# Patient Record
Sex: Male | Born: 1990 | Race: Black or African American | Hispanic: No | Marital: Single | Smoking: Never smoker
Health system: Southern US, Community
[De-identification: ages and names within clinical notes are randomized; demographics above are authoritative.]

## PROBLEM LIST (undated history)

## (undated) HISTORY — PX: HAND SURGERY: SHX662

## (undated) HISTORY — PX: OTHER SURGICAL HISTORY: SHX169

---

## 2012-09-05 ENCOUNTER — Emergency Department (HOSPITAL_BASED_OUTPATIENT_CLINIC_OR_DEPARTMENT_OTHER)
Admission: EM | Admit: 2012-09-05 | Discharge: 2012-09-05 | Disposition: A | Discharge status: Home / Self Care | Payer: Self-pay | Attending: Emergency Medicine | Admitting: Emergency Medicine

## 2012-09-05 ENCOUNTER — Emergency Department (HOSPITAL_BASED_OUTPATIENT_CLINIC_OR_DEPARTMENT_OTHER): Payer: Self-pay

## 2012-09-05 ENCOUNTER — Encounter (HOSPITAL_BASED_OUTPATIENT_CLINIC_OR_DEPARTMENT_OTHER): Payer: Self-pay | Admitting: *Deleted

## 2012-09-05 DIAGNOSIS — W219XXA Striking against or struck by unspecified sports equipment, initial encounter: Secondary | ICD-10-CM | POA: Insufficient documentation

## 2012-09-05 DIAGNOSIS — Y9367 Activity, basketball: Secondary | ICD-10-CM | POA: Insufficient documentation

## 2012-09-05 DIAGNOSIS — Y9239 Other specified sports and athletic area as the place of occurrence of the external cause: Secondary | ICD-10-CM | POA: Insufficient documentation

## 2012-09-05 DIAGNOSIS — IMO0002 Reserved for concepts with insufficient information to code with codable children: Secondary | ICD-10-CM | POA: Insufficient documentation

## 2012-09-05 MED ORDER — HYDROCODONE-ACETAMINOPHEN 5-325 MG PO TABS: 2.0000 | ORAL_TABLET | Freq: Four times a day (QID) | ORAL | Status: DC | PRN

## 2012-09-05 NOTE — ED Provider Notes (Signed)
History     CSN: [id number]  Arrival date & time 09/05/12  2136   First MD Initiated Contact with Patient 09/05/12 2150      Chief Complaint  Patient presents with  . Knee Injury    (Consider location/radiation/quality/duration/timing/severity/associated sxs/prior treatment) HPI Pt reports he was playing basketball earlier this evening when another player rolled onto his L leg, hyperextending his L knee. He is complaining of moderate to severe aching pain in knee, worse with movement and bearing weight.   History reviewed. No pertinent past medical history.  Past Surgical History  Procedure Laterality Date  . Hand surgery      No family history on file.  History  Substance Use Topics  . Smoking status: Never Smoker   . Smokeless tobacco: Never Used  . Alcohol Use: Not on file      Review of Systems All other systems reviewed and are negative except as noted in HPI.   Allergies  Review of patient's allergies indicates no known allergies.  Home Medications  No current outpatient prescriptions on file.  BP 131/86  Pulse 68  Temp(Src) 97.6 F (36.4 C) (Oral)  Resp 16  Ht 6\' 6"  (1.981 m)  Wt 220 lb (99.791 kg)  BMI 25.43 kg/m2  SpO2 99%  Physical Exam  Nursing note and vitals reviewed. Constitutional: He is oriented to person, place, and time. He appears well-developed and well-nourished.  HENT:  Head: Normocephalic and atraumatic.  Eyes: EOM are normal. Pupils are equal, round, and reactive to light.  Neck: Normal range of motion. Neck supple.  Cardiovascular: Normal rate, normal heart sounds and intact distal pulses.   Pulmonary/Chest: Effort normal and breath sounds normal.  Abdominal: Bowel sounds are normal. He exhibits no distension. There is no tenderness.  Musculoskeletal: Normal range of motion. He exhibits tenderness (tender to palpation of the L knee, minimal effusion, some laxity with posterior drawer). He exhibits no edema.  Neurological: He  is alert and oriented to person, place, and time. He has normal strength. No cranial nerve deficit or sensory deficit.  Skin: Skin is warm and dry. No rash noted.  Psychiatric: He has a normal mood and affect.    ED Course  Procedures (including critical care time)  Labs Reviewed - No data to display No results found.   No diagnosis found.    MDM  Xray reviewed, neg per my read, suspect a ligamentous injury. 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 Will apply a knee immobilizer, crutches, pain meds. Advised to followup with team physicians for recheck and consideration of MRI.         Charles B. Bernette Mayers, MD 09/06/12 1141

## 2012-09-05 NOTE — ED Notes (Addendum)
Injured left knee while playing basketball approximately 1 1/2 hours ago.  Another player fell onto his left leg and knee.  Left knee mildly TTP.  Pt. able to ambulate, but limps.  No swelling noted.

## 2012-09-05 NOTE — ED Notes (Signed)
Pt reprots playing basketball and another player landed on his left knee- walks with limp

## 2012-09-07 ENCOUNTER — Encounter (HOSPITAL_BASED_OUTPATIENT_CLINIC_OR_DEPARTMENT_OTHER): Payer: Self-pay | Admitting: *Deleted

## 2012-09-07 ENCOUNTER — Emergency Department (HOSPITAL_BASED_OUTPATIENT_CLINIC_OR_DEPARTMENT_OTHER)
Admission: EM | Admit: 2012-09-07 | Discharge: 2012-09-07 | Disposition: A | Discharge status: Home / Self Care | Payer: Self-pay | Attending: Emergency Medicine | Admitting: Emergency Medicine

## 2012-09-07 DIAGNOSIS — H53149 Visual discomfort, unspecified: Secondary | ICD-10-CM | POA: Insufficient documentation

## 2012-09-07 DIAGNOSIS — R609 Edema, unspecified: Secondary | ICD-10-CM | POA: Insufficient documentation

## 2012-09-07 DIAGNOSIS — H109 Unspecified conjunctivitis: Secondary | ICD-10-CM | POA: Insufficient documentation

## 2012-09-07 MED ORDER — ERYTHROMYCIN 5 MG/GM OP OINT
TOPICAL_OINTMENT | Freq: Four times a day (QID) | OPHTHALMIC | Status: DC
  Administered 2012-09-07: 1 via OPHTHALMIC
  Filled 2012-09-07: qty 3.5

## 2012-09-07 MED ORDER — TETRACAINE HCL 0.5 % OP SOLN
OPHTHALMIC | Status: AC
  Administered 2012-09-07: 23:00:00
  Filled 2012-09-07: qty 2

## 2012-09-07 MED ORDER — FLUORESCEIN SODIUM 1 MG OP STRP
ORAL_STRIP | OPHTHALMIC | Status: AC
  Administered 2012-09-07: 23:00:00
  Filled 2012-09-07: qty 1

## 2012-09-07 NOTE — ED Provider Notes (Signed)
History     CSN: [id number]  Arrival date & time 09/07/12  2112   First MD Initiated Contact with Patient 09/07/12 2311      Chief Complaint  Patient presents with  . Eye Pain    (Consider location/radiation/quality/duration/timing/severity/associated sxs/prior treatment) HPI This is a 22 year old male with pain, erythema and mild periorbital edema of the right eye. It began yesterday. He thinks it may have been triggered by him removing his contact lens in that eye. The pain is moderate. There is some mild photophobia. He denies blurred vision. His symptoms are eased if he keeps his eye shut.  History reviewed. No pertinent past medical history.  Past Surgical History  Procedure Laterality Date  . Hand surgery    . Thumb surgery      No family history on file.  History  Substance Use Topics  . Smoking status: Never Smoker   . Smokeless tobacco: Never Used  . Alcohol Use: 1.0 oz/week    2 drink(s) per week     Comment: social drinker      Review of Systems  All other systems reviewed and are negative.    Allergies  Review of patient's allergies indicates no known allergies.  Home Medications   Current Outpatient Rx  Name  Route  Sig  Dispense  Refill  . HYDROcodone-acetaminophen (NORCO/VICODIN) 5-325 MG per tablet   Oral   Take 2 tablets by mouth every 6 (six) hours as needed for pain.   30 tablet   0     BP 133/77  Pulse 58  Temp(Src) 98.6 F (37 C) (Oral)  Resp 16  Wt 220 lb (99.791 kg)  BMI 25.43 kg/m2  SpO2 100%  Physical Exam General: Well-developed, well-nourished male in no acute distress; appearance consistent with age of record HENT: normocephalic, atraumatic Eyes: pupils equal round and reactive to light; extraocular muscles intact; right conjunctival injection; no fluorescein uptake of right cornea; no purulent drainage from right eye; mild edema of the right eyelids; no hyphema Neck: supple Heart: regular rate and rhythm Lungs:  clear to auscultation bilaterally Abdomen: soft; nondistended Extremities: No deformity; full range of motion Neurologic: Awake, alert and oriented; motor function intact in all extremities and symmetric; no facial droop Skin: Warm and dry Psychiatric: Normal mood and affect    ED Course  Procedures (including critical care time)     MDM  No evidence of corneal abrasion on examination. We'll treat for acute conjunctivitis.        Hanley Seamen, MD 09/07/12 2325

## 2012-09-07 NOTE — ED Notes (Signed)
Rt eye pain. No known injury. Right eye is red with minimal swelling to the eye lid.

## 2012-09-07 NOTE — ED Notes (Signed)
Visual acuity. Rt eye 20/100 left eye 20/100. He is not wearing his contacts.

## 2013-04-29 ENCOUNTER — Emergency Department (HOSPITAL_BASED_OUTPATIENT_CLINIC_OR_DEPARTMENT_OTHER)
Admission: EM | Admit: 2013-04-29 | Discharge: 2013-04-29 | Disposition: A | Discharge status: Home / Self Care | Payer: Self-pay | Attending: Emergency Medicine | Admitting: Emergency Medicine

## 2013-04-29 ENCOUNTER — Encounter (HOSPITAL_BASED_OUTPATIENT_CLINIC_OR_DEPARTMENT_OTHER): Payer: Self-pay | Admitting: Emergency Medicine

## 2013-04-29 ENCOUNTER — Emergency Department (HOSPITAL_BASED_OUTPATIENT_CLINIC_OR_DEPARTMENT_OTHER): Payer: Self-pay

## 2013-04-29 DIAGNOSIS — Y9361 Activity, american tackle football: Secondary | ICD-10-CM | POA: Insufficient documentation

## 2013-04-29 DIAGNOSIS — X500XXA Overexertion from strenuous movement or load, initial encounter: Secondary | ICD-10-CM | POA: Insufficient documentation

## 2013-04-29 DIAGNOSIS — S93409A Sprain of unspecified ligament of unspecified ankle, initial encounter: Secondary | ICD-10-CM | POA: Insufficient documentation

## 2013-04-29 DIAGNOSIS — Y9239 Other specified sports and athletic area as the place of occurrence of the external cause: Secondary | ICD-10-CM | POA: Insufficient documentation

## 2013-04-29 DIAGNOSIS — S93401A Sprain of unspecified ligament of right ankle, initial encounter: Secondary | ICD-10-CM

## 2013-04-29 MED ORDER — NAPROXEN 250 MG PO TABS
500.0000 mg | ORAL_TABLET | Freq: Once | ORAL | Status: AC
  Administered 2013-04-29: 500 mg via ORAL
  Filled 2013-04-29: qty 2

## 2013-04-29 MED ORDER — NAPROXEN 500 MG PO TABS: 500.0000 mg | ORAL_TABLET | Freq: Two times a day (BID) | ORAL | Status: DC

## 2013-04-29 NOTE — ED Provider Notes (Signed)
CSN: [id number]     Arrival date & time 04/29/13  1031 History   First MD Initiated Contact with Patient 04/29/13 1129     Chief Complaint  Patient presents with  . Ankle Pain    Patient is a 22 y.o. male presenting with ankle pain. The history is provided by the patient.  Ankle Pain Location:  Ankle Injury: yes   Mechanism of injury comment:  Twisting while playing basketball Ankle location:  R ankle Pain details:    Quality:  Sharp   Radiates to:  Does not radiate   Severity:  Moderate Dislocation: no   Foreign body present:  No foreign bodies Prior injury to area:  Unable to specify Worsened by:  Activity Ineffective treatments:  None tried  patient denies any pain in the proximal part of his leg or in his right foot. Patient is not sure if his injured that ankle before  History reviewed. No pertinent past medical history. Past Surgical History  Procedure Laterality Date  . Hand surgery    . Thumb surgery     No family history on file. History  Substance Use Topics  . Smoking status: Never Smoker   . Smokeless tobacco: Never Used  . Alcohol Use: 1.0 oz/week    2 drink(s) per week     Comment: social drinker    Review of Systems  All other systems reviewed and are negative.    Allergies  Review of patient's allergies indicates no known allergies.  Home Medications   Current Outpatient Rx  Name  Route  Sig  Dispense  Refill  . naproxen (NAPROSYN) 500 MG tablet   Oral   Take 1 tablet (500 mg total) by mouth 2 (two) times daily.   30 tablet   0    BP 141/87  Pulse 61  Temp(Src) 98 F (36.7 C) (Oral)  Resp 16  Ht 6\' 6"  (1.981 m)  Wt 220 lb (99.791 kg)  BMI 25.43 kg/m2  SpO2 100% Physical Exam  Nursing note and vitals reviewed. Constitutional: He appears well-developed and well-nourished. No distress.  HENT:  Head: Normocephalic and atraumatic.  Right Ear: External ear normal.  Left Ear: External ear normal.  Eyes: Conjunctivae are normal. Right  eye exhibits no discharge. Left eye exhibits no discharge. No scleral icterus.  Neck: Neck supple. No tracheal deviation present.  Cardiovascular: Normal rate.   Pulmonary/Chest: Effort normal. No stridor. No respiratory distress.  Musculoskeletal: He exhibits no edema.       Right ankle: He exhibits swelling (primarily lateral malleolus). Tenderness. Lateral malleolus tenderness found. No medial malleolus, no head of 5th metatarsal and no proximal fibula tenderness found.  Neurological: He is alert. Cranial nerve deficit: no gross deficits.  Skin: Skin is warm and dry. No rash noted.  Psychiatric: He has a normal mood and affect.    ED Course  Procedures (including critical care time) Labs Review Labs Reviewed - No data to display Imaging Review Dg Ankle Complete Right  04/29/2013   CLINICAL DATA:  Rolled ankle yesterday playing basketball. Pain and swelling of the lateral side of ankle.  EXAM: RIGHT ANKLE - COMPLETE 3+ VIEW  COMPARISON:  None.  FINDINGS: There is moderate soft tissue swelling along the lateral aspect of the ankle. Small bony density is seen adjacent to the medial malleolus common possibly representing site of prior trauma. Less likely, this could be a small avulsion injury.  IMPRESSION: 1. Significant lateral soft tissue swelling. 2. Possible acute or  chronic avulsion type injury at the medial malleolus.   Electronically Signed   By: Rosalie Gums M.D.   On: 04/29/2013 11:04    EKG Interpretation   None       MDM   1. Ankle sprain, right, initial encounter     Symptoms are consistent with an ankle sprain. Patient placed in a splint and crutches. Discussed the possibility of an avulsion type fracture on the medial aspect all the patient does not have any swelling there. I recommended splint crutches and followup with an orthopedic Dr.   Celene Kras, MD 04/29/13 1143

## 2013-04-29 NOTE — ED Notes (Signed)
I placed the patient's right ankle into an ASO, I am unable to charge for this item.

## 2013-04-29 NOTE — ED Notes (Signed)
Patient here with right ankle pain after rolling same yesterday while playing basketball, swelling to same, positive distal pulses

## 2013-12-15 ENCOUNTER — Emergency Department (HOSPITAL_BASED_OUTPATIENT_CLINIC_OR_DEPARTMENT_OTHER): Payer: Self-pay

## 2013-12-15 ENCOUNTER — Emergency Department (HOSPITAL_BASED_OUTPATIENT_CLINIC_OR_DEPARTMENT_OTHER)
Admission: EM | Admit: 2013-12-15 | Discharge: 2013-12-15 | Disposition: A | Discharge status: Home / Self Care | Payer: Self-pay | Attending: Emergency Medicine | Admitting: Emergency Medicine

## 2013-12-15 ENCOUNTER — Encounter (HOSPITAL_BASED_OUTPATIENT_CLINIC_OR_DEPARTMENT_OTHER): Payer: Self-pay | Admitting: Emergency Medicine

## 2013-12-15 DIAGNOSIS — W219XXA Striking against or struck by unspecified sports equipment, initial encounter: Secondary | ICD-10-CM | POA: Insufficient documentation

## 2013-12-15 DIAGNOSIS — Z9889 Other specified postprocedural states: Secondary | ICD-10-CM | POA: Insufficient documentation

## 2013-12-15 DIAGNOSIS — Y92838 Other recreation area as the place of occurrence of the external cause: Secondary | ICD-10-CM

## 2013-12-15 DIAGNOSIS — Y9239 Other specified sports and athletic area as the place of occurrence of the external cause: Secondary | ICD-10-CM | POA: Insufficient documentation

## 2013-12-15 DIAGNOSIS — Z791 Long term (current) use of non-steroidal anti-inflammatories (NSAID): Secondary | ICD-10-CM | POA: Insufficient documentation

## 2013-12-15 DIAGNOSIS — Y9367 Activity, basketball: Secondary | ICD-10-CM | POA: Insufficient documentation

## 2013-12-15 DIAGNOSIS — S60221A Contusion of right hand, initial encounter: Secondary | ICD-10-CM

## 2013-12-15 DIAGNOSIS — S60229A Contusion of unspecified hand, initial encounter: Secondary | ICD-10-CM | POA: Insufficient documentation

## 2013-12-15 NOTE — Discharge Instructions (Signed)
Hand Contusion  A hand contusion is a deep bruise to the hand. Contusions happen when an injury causes bleeding under the skin. Signs of bruising include pain, puffiness (swelling), and discolored skin. The contusion may turn blue, purple, or yellow. HOME CARE  Put ice on the injured area.  Put ice in a plastic bag.  Place a towel between your skin and the bag.  Leave the ice on for 15-20 minutes, 03-04 times a day.  Only take medicines as told by your doctor.  Use an elastic wrap only as told. You may remove the wrap for sleeping, showering, and bathing. Take the wrap off if you lose feeling (have numbness) in your fingers, or they turn blue or cold. Put the wrap on more loosely.  Keep the hand raised (elevated) with pillows.  Avoid using your hand too much if it painful. GET HELP RIGHT AWAY IF:   You have redness, puffiness, or pain in your hand.  Your puffiness or pain does not get better with medicine.  You lose feeling in your hand, or you cannot move your fingers.  Your hand turns cold or blue.  You have pain when you move your fingers.  Your hand feels warm.  Your contusion does not get better in 7 days. MAKE SURE YOU:   Understand these instructions.  Will watch this condition.  Will get help right away if you are not doing well or you get worse. Document Released: 12/01/2007 Document Revised: 03/08/2012 Document Reviewed: 12/06/2011 [facility] Patient Information 2015 [employer], [place]. This information is not intended to replace advice given to you by your health care provider. Make sure you discuss any questions you have with your health care provider.

## 2013-12-15 NOTE — ED Notes (Signed)
PT rating pain 8/10.  There are no nonverbal signs of pain.

## 2013-12-15 NOTE — ED Provider Notes (Signed)
CSN: [id number]     Arrival date & time 12/15/13  1308 History   First MD Initiated Contact with Patient 12/15/13 1336     Chief Complaint  Patient presents with  . Hand Injury     (Consider location/radiation/quality/duration/timing/severity/associated sxs/prior Treatment) HPI 23 year old male playing basketball yesterday accidentally struck his right hand on the elbow of another player and patient has pain to his right hand third metacarpal region without pain to the rest of his hand without weakness or numbness except for some slight numbness in the scar area where he had prior (internal fixation with plate and screws of the third metacarpal previously. He is no weakness no swelling no deformity no breaking skin and no numbness or tingling to his fingers or thumb. He denies wrist pain forearm pain elbow pain or shoulder pain or other injuries or other concerns. History reviewed. No pertinent past medical history. Past Surgical History  Procedure Laterality Date  . Hand surgery    . Thumb surgery     History reviewed. No pertinent family history. History  Substance Use Topics  . Smoking status: Never Smoker   . Smokeless tobacco: Never Used  . Alcohol Use: 1.0 oz/week    2 drink(s) per week     Comment: social drinker    Review of Systems See HPI.   Allergies  Review of patient's allergies indicates no known allergies.  Home Medications   Prior to Admission medications   Medication Sig Start Date End Date Taking? Authorizing Provider  naproxen (NAPROSYN) 500 MG tablet Take 1 tablet (500 mg total) by mouth 2 (two) times daily. 04/29/13   Linwood DibblesJon Knapp, MD   BP 126/75  Pulse 52  Temp(Src) 98.3 F (36.8 C) (Oral)  Resp 16  SpO2 100% Physical Exam  Nursing note and vitals reviewed. Constitutional:  Awake, alert, nontoxic appearance.  HENT:  Head: Atraumatic.  Eyes: Right eye exhibits no discharge. Left eye exhibits no discharge.  Neck: Neck supple.  Cervical spine  nontender  Pulmonary/Chest: Effort normal.  Abdominal: Soft. There is no tenderness. There is no rebound.  Musculoskeletal: He exhibits tenderness.  Baseline ROM, no obvious new focal weakness. Right arm is no tenderness to the shoulder upper arm elbow forearm or wrist including no tenderness to the anatomic snuffbox or lunate region his right hand has capillary refill less than 2 seconds normal light touch good range of motion including 5/5 extension against resistance as well as FDP and FDS testing of his right long finger with no tenderness to his thumb index long ring or small fingers he does have isolated tenderness along his third metacarpal region only with slight numbness over his dorsal right hand scar region where he had prior surgery.  Neurological: He is alert.  Mental status and motor strength appears baseline for patient and situation.  Skin: No rash noted.  Psychiatric: He has a normal mood and affect.    ED Course  Procedures (including critical care time) Patient / Family / Caregiver informed of clinical course, understand medical decision-making process, and agree with plan. Labs Review Labs Reviewed - No data to display  Imaging Review No results found.   EKG Interpretation None      MDM   Final diagnoses:  Hand contusion, right, initial encounter    I doubt any other EMC precluding discharge at this time including, but not necessarily limited to the following:compartment syndrome.    Hurman HornJohn M Bednar, MD 12/17/13 1630

## 2013-12-15 NOTE — ED Notes (Signed)
Pt playing basketball yesterday, struck previously broken right hand on another player's elbow. Swelling and pain to third metacarpal. ROM diminished.

## 2015-02-02 ENCOUNTER — Emergency Department (HOSPITAL_BASED_OUTPATIENT_CLINIC_OR_DEPARTMENT_OTHER)
Admission: EM | Admit: 2015-02-02 | Discharge: 2015-02-02 | Disposition: A | Discharge status: Home / Self Care | Payer: Self-pay | Attending: Emergency Medicine | Admitting: Emergency Medicine

## 2015-02-02 ENCOUNTER — Encounter (HOSPITAL_BASED_OUTPATIENT_CLINIC_OR_DEPARTMENT_OTHER): Payer: Self-pay | Admitting: Adult Health

## 2015-02-02 DIAGNOSIS — Z23 Encounter for immunization: Secondary | ICD-10-CM | POA: Insufficient documentation

## 2015-02-02 DIAGNOSIS — W57XXXA Bitten or stung by nonvenomous insect and other nonvenomous arthropods, initial encounter: Secondary | ICD-10-CM | POA: Insufficient documentation

## 2015-02-02 DIAGNOSIS — Y9289 Other specified places as the place of occurrence of the external cause: Secondary | ICD-10-CM | POA: Insufficient documentation

## 2015-02-02 DIAGNOSIS — Z792 Long term (current) use of antibiotics: Secondary | ICD-10-CM | POA: Insufficient documentation

## 2015-02-02 DIAGNOSIS — S30861A Insect bite (nonvenomous) of abdominal wall, initial encounter: Secondary | ICD-10-CM | POA: Insufficient documentation

## 2015-02-02 DIAGNOSIS — Y998 Other external cause status: Secondary | ICD-10-CM | POA: Insufficient documentation

## 2015-02-02 DIAGNOSIS — Y9389 Activity, other specified: Secondary | ICD-10-CM | POA: Insufficient documentation

## 2015-02-02 DIAGNOSIS — S40861A Insect bite (nonvenomous) of right upper arm, initial encounter: Secondary | ICD-10-CM | POA: Insufficient documentation

## 2015-02-02 MED ORDER — DIPHENHYDRAMINE HCL 25 MG PO CAPS
50.0000 mg | ORAL_CAPSULE | Freq: Once | ORAL | Status: AC
Start: 2015-02-02 — End: 2015-02-02
  Administered 2015-02-02: 50 mg via ORAL
  Filled 2015-02-02: qty 2

## 2015-02-02 MED ORDER — TETANUS-DIPHTH-ACELL PERTUSSIS 5-2.5-18.5 LF-MCG/0.5 IM SUSP
0.5000 mL | Freq: Once | INTRAMUSCULAR | Status: AC
  Administered 2015-02-02: 0.5 mL via INTRAMUSCULAR
  Filled 2015-02-02: qty 0.5

## 2015-02-02 NOTE — ED Provider Notes (Signed)
CSN: [id number]     Arrival date & time 02/02/15  2259 History   First MD Initiated Contact with Patient 02/02/15 2309     Chief Complaint  Patient presents with  . Insect Bite     (Consider location/radiation/quality/duration/timing/severity/associated sxs/prior Treatment) HPI  Blood pressure 146/87, pulse 57, temperature 98.7 F (37.1 C), temperature source Oral, resp. rate 18, SpO2 100 %.  Viggo Perko is a 24 y.o. male complaining of insect bite to right arm and torso which she noticed this morning. Patient states that he was staying at [facility] in [place] and he is concerned about that but bites. He did not see the insect that bit him. He slept in the room alone. Last tetanus shot is unknown.  History reviewed. No pertinent past medical history. Past Surgical History  Procedure Laterality Date  . Hand surgery    . Thumb surgery     History reviewed. No pertinent family history. History  Substance Use Topics  . Smoking status: Never Smoker   . Smokeless tobacco: Never Used  . Alcohol Use: 1.0 oz/week    2 drink(s) per week     Comment: social drinker    Review of Systems  10 systems reviewed and found to be negative, except as noted in the HPI.  Allergies  Review of patient's allergies indicates no known allergies.  Home Medications   Prior to Admission medications   Medication Sig Start Date End Date Taking? Authorizing Provider  naproxen (NAPROSYN) 500 MG tablet Take 1 tablet (500 mg total) by mouth 2 (two) times daily. 04/29/13   Linwood Dibbles, MD   BP 146/87 mmHg  Pulse 57  Temp(Src) 98.7 F (37.1 C) (Oral)  Resp 18  SpO2 100% Physical Exam  Constitutional: He is oriented to person, place, and time. He appears well-developed and well-nourished. No distress.  HENT:  Head: Normocephalic.  Eyes: Conjunctivae and EOM are normal.  Cardiovascular: Normal rate.   Pulmonary/Chest: Effort normal. No stridor.  Musculoskeletal: Normal range of motion.   Neurological: He is alert and oriented to person, place, and time.  Skin: Rash noted.  Pinpoint punctures with trace surrounding erythema to right biceps and on inguinal area. There is no warmth, surrounding cellulitis, tenderness palpation or discharge.  Psychiatric: He has a normal mood and affect.  Nursing note and vitals reviewed.   ED Course  Procedures (including critical care time) Labs Review Labs Reviewed - No data to display  Imaging Review No results found.   EKG Interpretation None      MDM   Final diagnoses:  Insect bites    Filed Vitals:   02/02/15 2311  BP: 146/87  Pulse: 57  Temp: 98.7 F (37.1 C)  TempSrc: Oral  Resp: 18  SpO2: 100%    Medications  Tdap (BOOSTRIX) injection 0.5 mL (not administered)  diphenhydrAMINE (BENADRYL) capsule 50 mg (not administered)    Efe Fazzino is a pleasant 24 y.o. male presenting with 2 lesions consistent with insect bites. He is concerned about bedbugs. Of explained to him that I cannot ascertain what it was that bit him but 2 isolated bites or not textbook consistent with bedbugs. I've advised him to have caution in taking any material that was in the hotel room into his home and advised a very high temperature wash of any clothes. Tetanus will be updated and Benadryl given.  Evaluation does not show pathology that would require ongoing emergent intervention or inpatient treatment. Pt is hemodynamically stable and mentating  appropriately. Discussed findings and plan with patient/guardian, who agrees with care plan. All questions answered. Return precautions discussed and outpatient follow up given.        Wynetta Emery, PA-C 02/02/15 2325  April Palumbo, MD 02/02/15 2337

## 2015-02-02 NOTE — ED Notes (Signed)
Pt was staying at [facility] in [place] up with a "bed Bug" on him and had a bite on right arm and in left lower abdomen.

## 2015-02-02 NOTE — Discharge Instructions (Signed)
1 to 2 tablets of 25 mg Benadryl pills every 4-6 hours as needed to a maximum of 300 mg per day. In addition, you may apply a topical hydrocortisone ointment to all affected areas except for the face.   Do not hesitate to return to the emergency room for any new, worsening or concerning symptoms.  Please obtain primary care using resource guide below. Let them know that you were seen in the emergency room and that they will need to obtain records for further outpatient management.   Bedbugs Bedbugs are tiny bugs that live in and around beds. During the day, they hide in mattresses and other places near beds. They come out at night and bite people lying in bed. They need blood to live and grow. Bedbugs can be found in beds anywhere. Usually, they are found in places where many people come and go (hotels, shelters, hospitals). It does not matter whether the place is dirty or clean. Getting bitten by bedbugs rarely causes a medical problem. The biggest problem can be getting rid of them. This often takes the work of a Oncologist. CAUSES  Less use of pesticides. Bedbugs were common before the 1950s. Then, strong pesticides such as DDT nearly wiped them out. Today, these pesticides are not used because they harm the environment and can cause health problems.  More travel. Besides mattresses, bedbugs can also live in clothing and luggage. They can come along as people travel from place to place. Bedbugs are more common in certain parts of the world. When people travel to those areas, the bugs can come home with them.  Presence of birds and bats. Bedbugs often infest birds and bats. If you have these animals in or near your home, bedbugs may infest your house, too. SYMPTOMS It does not hurt to be bitten by a bedbug. You will probably not wake up when you are bitten. Bedbugs usually bite areas of the skin that are not covered. Symptoms may show when you wake up, or they may take a day or more to  show up. Symptoms may include:  Small red bumps on the skin. These might be lined up in a row or clustered in a group.  A darker red dot in the middle of red bumps.  Blisters on the skin. There may be swelling and very bad itching. These may be signs of an allergic reaction. This does not happen often. DIAGNOSIS Bedbug bites might look and feel like other types of insect bites. The bugs do not stay on the body like ticks or lice. They bite, drop off, and crawl away to hide. Your caregiver will probably:  Ask about your symptoms.  Ask about your recent activities and travel.  Check your skin for bedbug bites.  Ask you to check at home for signs of bedbugs. You should look for:  Spots or stains on the bed or nearby. This could be from bedbugs that were crushed or from their eggs or waste.  Bedbugs themselves. They are reddish-brown, oval, and flat. They do not fly. They are about the size of an apple seed.  Places to look for bedbugs include:  Beds. Check mattresses, headboards, box springs, and bed frames.  On drapes and curtains near the bed.  Under carpeting in the bedroom.  Behind electrical outlets.  Behind any wallpaper that is peeling.  Inside luggage. TREATMENT Most bedbug bites do not need treatment. They usually go away on their own in a few days. The  bites are not dangerous. However, treatment may be needed if you have scratched so much that your skin has become infected. You may also need treatment if you are allergic to bedbug bites. Treatment options include:  A drug that stops swelling and itching (corticosteroid). Usually, a cream is rubbed on the skin. If you have a bad rash, you may be given a corticosteroid pill.  Oral antihistamines. These are pills to help control itching.  Antibiotic medicines. An antibiotic may be prescribed for infected skin. HOME CARE INSTRUCTIONS   Take any medicine prescribed by your caregiver for your bites. Follow the directions  carefully.  Consider wearing pajamas with long sleeves and pant legs.  Your bedroom may need to be treated. A pest control expert should make sure the bedbugs are gone. You may need to throw away mattresses or luggage. Ask the pest control expert what you can do to keep the bedbugs from coming back. Common suggestions include:  Putting a plastic cover over your mattress.  Washing and drying your clothes and bedding in hot water and a hot dryer. The temperature should be hotter than 120 F (48.9 C). Bedbugs are killed by high temperatures.  Vacuuming carefully all around your bed. Vacuum in all cracks and crevices where the bugs might hide. Do this often.  Carefully checking all used furniture, bedding, or clothes that you bring into your house.  Eliminating bird nests and bat roosts.  If you get bedbug bites when traveling, check all your possessions carefully before bringing them into your house. If you find any bugs on clothes or in your luggage, consider throwing those items away. SEEK MEDICAL CARE IF:  You have red bug bites that keep coming back.  You have red bug bites that itch badly.  You have bug bites that cause a skin rash.  You have scratch marks that are red and sore. SEEK IMMEDIATE MEDICAL CARE IF: You have a fever. Document Released: 07/17/2010 Document Revised: 09/06/2011 Document Reviewed: 07/17/2010 [facility] Patient Information 2015 [place], [place]. This information is not intended to replace advice given to you by your health care provider. Make sure you discuss any questions you have with your health care provider.   Emergency Department Resource Guide 1) Find a Doctor and Pay Out of Pocket Although you won't have to find out who is covered by your insurance plan, it is a good idea to ask around and get recommendations. You will then need to call the office and see if the doctor you have chosen will accept you as a new patient and what types of options they  offer for patients who are self-pay. Some doctors offer discounts or will set up payment plans for their patients who do not have insurance, but you will need to ask so you aren't surprised when you get to your appointment.  2) Contact Your Local Health Department Not all health departments have doctors that can see patients for sick visits, but many do, so it is worth a call to see if yours does. If you don't know where your local health department is, you can check in your phone book. The CDC also has a tool to help you locate your state's health department, and many state websites also have listings of all of their local health departments.  3) Find a Walk-in Clinic If your illness is not likely to be very severe or complicated, you may want to try a walk in clinic. These are popping up all over the  country in pharmacies, drugstores, and shopping centers. They're usually staffed by nurse practitioners or physician assistants that have been trained to treat common illnesses and complaints. They're usually fairly quick and inexpensive. However, if you have serious medical issues or chronic medical problems, these are probably not your best option.  No Primary Care Doctor: - Call Health Connect at  [phone] - they can help you locate a primary care doctor that  accepts your insurance, provides certain services, etc. - Physician Referral Service- [phone]  Chronic Pain Problems: Organization         Address  Phone   Notes  [facility]  [phone] Patients need to be referred by their primary care doctor.   Medication Assistance: Organization         Address  Phone   Notes  [facility] Medication [facility] [address] [place], [place] [address] [phone] --Must be a resident of [facility] -- Must have NO insurance coverage whatsoever (no Medicaid/ Medicare, etc.) -- The pt. MUST have a primary care doctor that directs their care  regularly and follows them in the community   [facility]  [phone]   [employer]  [phone]    Agencies that provide inexpensive medical care: Organization         Address  Phone   Notes  [facility]  [phone]   [facility]    [phone]   [facility] [address] [place], [place] [address] [phone]   [facility] [address], [place] [phone]   Planned Parenthood    [phone]   [facility]    [phone]   [facility]  [address], [place] Phone:  [phone], Fax:  [phone] Hours of Operation:  9 am - 6 pm, M-F.  Also accepts Medicaid/Medicare and self-pay.  [facility]  [address], [place] Phone: [phone], Fax: [phone]. Hours of Operation:  8:30 am - 5:30 pm, M-F.  Also accepts Medicaid and self-pay.  [facility] [place] [address] [place] Phone: [phone]   [facility] [address] Natasha Bence [place], [place] [phone], Ext. [phone] Mondays & Thursdays: 7-9 AM.  First 15 patients are seen on a first come, first serve basis.    Medicaid-accepting [facility] Providers:  Organization         Address  Phone   Notes  [facility] [address], [place] [phone] Also accepts self-pay patients.  [facility] [address] Laurell Josephs [place], [place]  [phone]   [facility] [address], [place] [phone]   [facility] [address], [place] [phone]   Renaye Rakers [address], [place]   [phone] Only accepts [place] patients after they have their name applied to their card.   Self-Pay (no insurance) in [facility]:  Organization         Address  Phone    Notes  Sickle Cell Patients, [facility] [address] [place], [place] [phone]   [facility] [address] [place], [place] [phone]   [facility] [place]  [address]  [address], [place] [phone]   [facility]/Dr. Osei-Bonsu  [address] [place] [address], [place] or [address], [place] [phone] Phone number for both [place] and [place] locations is the same.  [facility] [address], [place] [phone]   [facility] [place] [address] [place] [address], [place] or [address] [phone]   [facility] [address], [place] [phone], phone; [phone], fax Sees patients 1st and 3rd Saturday of every month.  Must not qualify for public or private insurance (i.e. Medicaid, Medicare, [place] Health Choice, Veterans' Benefits)  Household income should be no more than 200% of the poverty level The clinic cannot treat you if you are pregnant or think you are pregnant  Sexually transmitted diseases are not treated at the clinic.    Dental Care: Organization         Address  Phone  Notes  [facility] [address], [place] [phone] Accepts children up to age 25 who are enrolled in [place] or [place] Health Choice; pregnant women with a Medicaid card; and children who have applied for Medicaid or [place] Health Choice, but were declined, whose parents can pay a reduced fee at time of service.  [facility] [place]  [address], [place] [phone] Accepts children up to age 25 who are enrolled in [place] or [place] Health Choice; pregnant women with a Medicaid card; and children who have applied for Medicaid or [place] Health Choice, but were declined, whose parents can pay a reduced fee at time of service.  [facility]  [address], [place] [phone] Patients are seen by appointment only. Walk-ins are not accepted. [facility] will see patients 25 years of age and older. Monday - Tuesday (8am-5pm) Most Wednesdays (8:30-5pm) $30 per visit, cash only  [facility]  [address], [place] [facility] [phone] Patients are seen by appointment only. Walk-ins are not accepted. [facility] will see patients 25 years of age and older. One Wednesday Evening (Monthly: Volunteer Based).  $30 per visit, cash only  [employer] [facility]  [phone] for adults; Children under age 25, call Graduate Pediatric Dentistry at [phone]. Children aged 714-14, please call [phone] to request a pediatric application.  Dental services are provided in all areas of dental care including fillings, crowns and bridges, complete and partial dentures, implants, gum treatment, root canals, and extractions. Preventive care is also provided. Treatment is provided to both adults and children. Patients are selected via a lottery and there is often a waiting list.   [facility] [address], [place]  [phone] [email or web address]   [facility] [address], [place], [place] [phone], Ext. [phone] Second and Fourth Thursday of each month, opens at 6:30 AM; Clinic ends at 9 AM.  Patients are seen on a first-come first-served basis, and a limited number are seen during each clinic.   [facility]  [address], [place], [place] [phone]   Eligibility Requirements You must have lived in [place], [place], or [place] counties for at least the last three months.   You cannot be eligible for state or federal sponsored [place]healthcare insurance, including CIGNAVeterans Administration, [place]icaid, or [employer]icare.   You generally  cannot be eligible for healthcare insurance through your employer.    How to  apply: Eligibility screenings are held every Tuesday and Wednesday afternoon from 1:00 pm until 4:00 pm. You do not need an appointment for the interview!  [facility] [address] [place], [place] [phone]   [facility] Health Department  [phone]   [facility] Health Department  [phone]   [facility] Health Department  [phone]    Behavioral Health Resources in the Community: Intensive Outpatient Programs Organization         Address  Phone  Notes  [facility] [address], [place], [place] [phone]   [facility] [address] [place], [place] [phone]   [facility] [address] [place], [place]  [phone]   [facility] [address]  [place], [place] [phone] or [phone]   Substance Abuse Resources Organization         Address  Phone  Notes  [facility] Drug [facility]  [phone]   [facility]  [phone]   [facility]  [phone]   Floydene Flock  [phone]   Residential & Outpatient Substance Abuse Program  7861959745   Psychological Services Organization         Address  Phone  Notes  [facility]  [phone]   [facility]  [phone]   [facility] Mental Health [address] [place] [phone] or [phone]    Mobile Crisis Teams Organization         Address  Phone  Notes  [facility], Mobile Crisis Care Unit  [phone]   [facility]  [address] [place], [place] [phone]   Doristine Locks [address] [place] [phone]    Self-Help/Support Groups Organization         Address  Phone             Notes  [facility] [place] - variety of support groups  336- I7437963 Call for more information  Narcotics Anonymous (NA), [facility] [address], [employer] [place]  2 meetings at this location   Statistician         Address  Phone  Notes  [facility] [address],    [place]  [phone]   [facility]  [address] [place] [address], [place], [place] [phone]   [facility] [address] [place], [place] [phone] Admissions: 8am-3pm M-F  [facility] [address],    [place], [place] [phone]   [facility] [address] [place], [place], [place] [phone]   [facility] [address]  [place], [place] [phone]   [facility] - Intensive Outpatient [address] Laurell Josephs [address], [place], [place] [phone]   [facility] ([facility] [address]  [place], [place] [phone] or [phone]   [facility] ([facility]) [address] [place], [place] [phone] Accepts Medicaid  [facility] [place] [address],  [place] [phone] Substance Abuse/Addiction Treatment   [facility] Organization         Address  Phone  Notes  [facility]  [phone]   Angie Fava, PhD [address] Ervin Knack [place], [place]   [phone] or [phone]   [facility]   [address] [place], [place] [phone]   [facility] [address] [place], [place] [phone] Insurance/Medicaid/sponsorship through [facility] [address]                                    [place], [place] [phone] Therapy/tele-psych/case  [facility] [address] [place] [address]   [place], [place] [phone]    Dr. Lolly Mustache  [phone]   [facility]  [employer] [facility] Dept. 1) [address], [place] 2) [address] [place] 3)  [address], [place] [phone]  [phone]   [facility] Child Abuse Hotline  (575)285-2748 or [phone] (After Hours)

## 2018-03-12 ENCOUNTER — Emergency Department (HOSPITAL_BASED_OUTPATIENT_CLINIC_OR_DEPARTMENT_OTHER): Payer: BLUE CROSS/BLUE SHIELD

## 2018-03-12 ENCOUNTER — Other Ambulatory Visit: Payer: Self-pay

## 2018-03-12 ENCOUNTER — Encounter (HOSPITAL_BASED_OUTPATIENT_CLINIC_OR_DEPARTMENT_OTHER): Payer: Self-pay | Admitting: *Deleted

## 2018-03-12 ENCOUNTER — Emergency Department (HOSPITAL_BASED_OUTPATIENT_CLINIC_OR_DEPARTMENT_OTHER)
Admission: EM | Admit: 2018-03-12 | Discharge: 2018-03-12 | Disposition: A | Discharge status: Home / Self Care | Payer: BLUE CROSS/BLUE SHIELD | Attending: Emergency Medicine | Admitting: Emergency Medicine

## 2018-03-12 DIAGNOSIS — M25512 Pain in left shoulder: Secondary | ICD-10-CM | POA: Insufficient documentation

## 2018-03-12 MED ORDER — IBUPROFEN 800 MG PO TABS
800.0000 mg | ORAL_TABLET | Freq: Once | ORAL | Status: AC
  Administered 2018-03-12: 800 mg via ORAL
  Filled 2018-03-12: qty 1

## 2018-03-12 MED ORDER — IBUPROFEN 600 MG PO TABS: 600.0000 mg | ORAL_TABLET | Freq: Four times a day (QID) | ORAL | 0 refills | Status: DC | PRN

## 2018-03-12 NOTE — ED Triage Notes (Signed)
Larey SeatFell about 2 weeks ago playing basketball w inj to bilateral shoulders,  Was seen at an er had x rays made which were neg but still c/o pain

## 2018-03-12 NOTE — ED Provider Notes (Signed)
[facility] EMERGENCY DEPARTMENT Provider Note   CSN: [id number] Arrival date & time: 03/12/18  1235     History   Chief Complaint Chief Complaint  Patient presents with  . Shoulder Pain    HPI Justin Morris is a 27 y.o. male no significant past medical history presents emergency department today for left shoulder pain.  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  He was going out for rebound approximately 2 weeks ago when he came down onto his left elbow and left shoulder.  He notes he has had pain in the left shoulder since that time.  He denies any head trauma or loss of consciousness at the time of event.  He denies any associated neck pain, numbness/tingling or weakness of the extremity.  He reports his pain is worsened with flexion, abduction, adduction, internal and external rotation.  He notes that external rotation and flexion make his symptoms the worse. He reports that the pain keeps him up at night when he tries to sleep on that shoulder. He has tried over-the-counter medication for symptoms without any relief.  He reports that this is affecting his ability to play basketball which is why he presents for evaluation today.  He does not have a primary care provider.  No other complaints at this time. Patient is right handed  HPI  No past medical history on file.  There are no active problems to display for this patient.   Past Surgical History:  Procedure Laterality Date  . HAND SURGERY    . thumb surgery          Home Medications    Prior to Admission medications   Medication Sig Start Date End Date Taking? Authorizing Provider  naproxen (NAPROSYN) 500 MG tablet Take 1 tablet (500 mg total) by mouth 2 (two) times daily. 04/29/13   Linwood Dibbles, MD    Family History No family history on file.  Social History Social History   Tobacco Use  . Smoking status: Never Smoker  . Smokeless tobacco: Never Used  Substance Use Topics  . Alcohol  use: Yes    Alcohol/week: 2.0 standard drinks    Types: 2 drink(s) per week    Comment: social drinker  . Drug use: No     Allergies   Patient has no known allergies.   Review of Systems Review of Systems  Constitutional: Negative for fever.  Musculoskeletal: Positive for arthralgias. Negative for joint swelling and neck pain.  Skin: Negative for wound.  Neurological: Negative for weakness and numbness.  All other systems reviewed and are negative.    Physical Exam Updated Vital Signs BP 140/87 (BP Location: Left Arm)   Pulse 66   Temp 98.3 F (36.8 C) (Oral)   Resp 18   Ht 6\' 6"  (1.981 m)   Wt 108.9 kg   SpO2 99%   BMI 27.73 kg/m   Physical Exam  Constitutional: He appears well-developed and well-nourished.  HENT:  Head: Normocephalic and atraumatic.  Right Ear: External ear normal.  Left Ear: External ear normal.  Eyes: Conjunctivae are normal. Right eye exhibits no discharge. Left eye exhibits no discharge. No scleral icterus.  Neck: Normal range of motion and full passive range of motion without pain. Neck supple. No spinous process tenderness and no muscular tenderness present. Normal range of motion present.  No C-spine tenderness palpation or step-offs.  No paraspinal muscle tenderness palpation.  Normal range of motion of the cervical spine for flexion,  extension, rotation.  Negative Spurling's test  Pulmonary/Chest: Effort normal. No respiratory distress.  Musculoskeletal:  Cervical Spine: Appearance normal. No obvious bony deformity. No skin swelling, erythema, heat, fluctuance or break of the skin. No TTP over the cervical spinous processes. No paraspinal tenderness. No step-offs. Patient is able to actively rotate their neck 45 degrees left and right voluntarily without pain and flex and extend the neck without pain.  Negative Spurling's  Left Shoulder: Appearance normal. No obvious bony deformity. No skin swelling, erythema, heat, fluctuance or break of the  skin. No clavicular deformity or TTP. TTP over bicipital groove, and posterior scapular spine.  Mild tenderness over AC joint.  Noted limited range of motion for flexion, abduction, as well as internal and external rotation.  He notes pain with maximal ends of range of motion for each of these movements.  He is able extension and adduction.  Through these movements are intact but decreased secondary to pain.  Equivocal speeds and Yergason test.  Negative liftoff test.  Negative drop arm test. Left Elbow: Appearance normal. No obvious bony deformity. No skin swelling, erythema, heat, fluctuance or break of the skin. No TTP over joint. Active flexion, extension, supination and pronation full and intact without pain. Strength able and appropriate for age for flexion and extension.  Radial Pulse 2+. Cap refill <2 seconds. SILT for M/U/R distributions. Compartments soft.   Neurological: He is alert. No sensory deficit.  Skin: Skin is warm and dry. Capillary refill takes less than 2 seconds. No erythema. No pallor.  Psychiatric: He has a normal mood and affect.  Nursing note and vitals reviewed.    ED Treatments / Results  Labs (all labs ordered are listed, but only abnormal results are displayed) Labs Reviewed - No data to display  EKG None  Radiology Dg Shoulder Left  Result Date: 03/12/2018 CLINICAL DATA:  Fall while playing basketball with shoulder pain, initial encounter EXAM: LEFT SHOULDER - 2+ VIEW COMPARISON:  None. FINDINGS: There is no evidence of fracture or dislocation. There is no evidence of arthropathy or other focal bone abnormality. Soft tissues are unremarkable. IMPRESSION: No acute abnormality noted. Electronically Signed   By: Alcide Clever M.D.   On: 03/12/2018 14:38    Procedures Procedures (including critical care time)  Medications Ordered in ED Medications  ibuprofen (ADVIL,MOTRIN) tablet 800 mg (800 mg Oral Given 03/12/18 1504)     Initial Impression / Assessment  and Plan / ED Course  I have reviewed the triage vital signs and the nursing notes.  Pertinent labs & imaging results that were available during my care of the patient were reviewed by me and considered in my medical decision making (see chart for details).     27 y.o. male with left shoulder pain after a fall playing basketball approximately 2 weeks ago.  Patient is neurovascular intact and compartments are soft.  He denies any head trauma or loss conscious.  He denies any neck pain.  No evidence of cervical radiculopathy.  No C-spine tenderness palpation or step-offs.  Patient's exam is concerning for rotator cuff tendinitis versus injury. No obvious tear on exam. X-rays are unremarkable. Images reviewed by myself.  Patient placed in a sling will treat conservatively with NSAIDs, rest, elevation.  Recommend patient to take his arm out of the sling multiple times per day and perform shoulder range of motion exercise to prevent frozen shoulder.  He is to call orthopedics versus sports medicine tomorrow for follow-up.  Return precautions were  discussed.  Patient appears safe for discharge.  Final Clinical Impressions(s) / ED Diagnoses   Final diagnoses:  Acute pain of left shoulder    ED Discharge Orders         Ordered    ibuprofen (ADVIL,MOTRIN) 600 MG tablet  Every 6 hours PRN     03/12/18 1501           Princella PellegriniMaczis, Joud Pettinato M, PA-C 03/12/18 1726    Vanetta MuldersZackowski, Scott, MD 03/15/18 [phone]

## 2018-03-12 NOTE — Discharge Instructions (Signed)
Please read and follow all provided instructions.  You have been seen today for left shoulder pain  Tests performed today include: An x-ray of the affected area - does NOT show any broken bones or dislocations.  Vital signs. See below for your results today.   Home care instructions: -- *PRICE in the first 24-48 hours after injury: Protect (with brace, splint, sling), if given by your provider - -please take your shoulder out of shoulder sling at least once per day and perform shoulder range of motion exercises in order to prevent frozen shoulder. Rest Ice- Do not apply ice pack directly to your skin, place towel or similar between your skin and ice/ice pack. Apply ice for 20 min, then remove for 40 min while awake Compression- Wear brace, elastic bandage, splint as directed by your provider Elevate affected extremity above the level of your heart when not walking around for the first 24-48 hours   Use Ibuprofen (Motrin/Advil) 600mg  every 6 hours as needed for pain (do not exceed max dose in 24 hours, 2400mg )  Follow-up instructions: Please call tomorrow to sports medicine or orthopedics to schedule a follow up appointment.    Return instructions:  You develop neck pain or visual changes You develop any numbness or tingling of the extremity.  You develop any fever Please return to the Emergency Department if you experience worsening symptoms.  Please return if you have any other emergent concerns. Additional Information:  Your vital signs today were: BP 140/87 (BP Location: Left Arm)    Pulse 66    Temp 98.3 F (36.8 C) (Oral)    Resp 18    Ht 6\' 6"  (1.981 m)    Wt 108.9 kg    SpO2 99%    BMI 27.73 kg/m  If your blood pressure (BP) was elevated above 135/85 this visit, please have this repeated by your doctor within one month. ---------------

## 2018-03-12 NOTE — ED Notes (Signed)
Pt d/c home with rx x 1 for ibuprofen

## 2019-11-23 ENCOUNTER — Other Ambulatory Visit: Payer: Self-pay

## 2019-11-23 ENCOUNTER — Ambulatory Visit (HOSPITAL_COMMUNITY)
Admission: EM | Admit: 2019-11-23 | Discharge: 2019-11-23 | Disposition: A | Discharge status: Home / Self Care | Payer: BLUE CROSS/BLUE SHIELD | Attending: Internal Medicine | Admitting: Internal Medicine

## 2019-11-23 ENCOUNTER — Encounter (HOSPITAL_COMMUNITY): Payer: Self-pay

## 2019-11-23 DIAGNOSIS — Z202 Contact with and (suspected) exposure to infections with a predominantly sexual mode of transmission: Secondary | ICD-10-CM | POA: Insufficient documentation

## 2019-11-23 NOTE — ED Provider Notes (Signed)
[facility]    CSN: [id number] Arrival date & time: 11/23/19  0805      History   Chief Complaint Chief Complaint  Patient presents with  . Exposure to STD    HPI Justin Morris is a 29 y.o. male comes to the urgent care requesting STD testing.  Patient's former sexual partner informed him that he she was diagnosed with gonorrhea.  The patient former sexual partner had multiple sexual partners.  Patient has no symptoms.  No groin pain or testicular pain.Marland Kitchen   HPI  History reviewed. No pertinent past medical history.  There are no problems to display for this patient.   Past Surgical History:  Procedure Laterality Date  . HAND SURGERY    . thumb surgery         Home Medications    Prior to Admission medications   Not on File    Family History History reviewed. No pertinent family history.  Social History Social History   Tobacco Use  . Smoking status: Never Smoker  . Smokeless tobacco: Never Used  Substance Use Topics  . Alcohol use: Yes    Alcohol/week: 2.0 standard drinks    Types: 2 drink(s) per week    Comment: social drinker  . Drug use: No     Allergies   Patient has no known allergies.   Review of Systems Review of Systems  Constitutional: Negative.   Genitourinary: Negative.   Musculoskeletal: Negative.      Physical Exam Triage Vital Signs ED Triage Vitals  Enc Vitals Group     BP 11/23/19 0824 (!) 142/91     Pulse Rate 11/23/19 0824 74     Resp 11/23/19 0824 19     Temp 11/23/19 0824 97.6 F (36.4 C)     Temp Source 11/23/19 0824 Oral     SpO2 11/23/19 0824 99 %     Weight --      Height --      Head Circumference --      Peak Flow --      Pain Score 11/23/19 0826 0     Pain Loc --      Pain Edu? --      Excl. in [place]? --    No data found.  Updated Vital Signs BP (!) 142/91 (BP Location: Left Arm)   Pulse 74   Temp 97.6 F (36.4 C) (Oral)   Resp 19   SpO2 99%   Visual Acuity Right Eye Distance:    Left Eye Distance:   Bilateral Distance:    Right Eye Near:   Left Eye Near:    Bilateral Near:     Physical Exam Vitals and nursing note reviewed.  Constitutional:      Appearance: Normal appearance.  Genitourinary:    Penis: Normal.      Testes: Normal.  Musculoskeletal:        General: Normal range of motion.  Skin:    General: Skin is warm.  Neurological:     Mental Status: He is alert.      UC Treatments / Results  Labs (all labs ordered are listed, but only abnormal results are displayed) Labs Reviewed  CYTOLOGY, (ORAL, ANAL, URETHRAL) ANCILLARY ONLY    EKG   Radiology No results found.  Procedures Procedures (including critical care time)  Medications Ordered in UC Medications - No data to display  Initial Impression / Assessment and Plan / UC Course  I have reviewed the triage  vital signs and the nursing notes.  Pertinent labs & imaging results that were available during my care of the patient were reviewed by me and considered in my medical decision making (see chart for details).     1.  Possible exposure to STD: Cytology for GC/chlamydia/trichomonas Safe sex practices encouraged Patient is encouraged to sign up for MyChart If the patient's lab results are significant, we will call the patient and recommend the appropriate treatment. Final Clinical Impressions(s) / UC Diagnoses   Final diagnoses:  Possible exposure to STD   Discharge Instructions   None    ED Prescriptions    None     PDMP not reviewed this encounter.   Merrilee Jansky, MD 11/23/19 [phone]

## 2019-11-23 NOTE — ED Triage Notes (Signed)
Pt presents to UC for STD's testing. [Pt reports a sexual partner tested positive for Gonorrhea. Pt denies any sysmptoms.

## 2019-11-28 LAB — CYTOLOGY, (ORAL, ANAL, URETHRAL) ANCILLARY ONLY
Chlamydia: NEGATIVE
Comment: NEGATIVE
Comment: NEGATIVE
Comment: NORMAL
Neisseria Gonorrhea: NEGATIVE
Trichomonas: NEGATIVE

## 2020-03-05 ENCOUNTER — Other Ambulatory Visit: Payer: Self-pay

## 2020-03-05 DIAGNOSIS — Z20822 Contact with and (suspected) exposure to covid-19: Secondary | ICD-10-CM

## 2020-03-07 LAB — SARS-COV-2, NAA 2 DAY TAT

## 2020-03-07 LAB — NOVEL CORONAVIRUS, NAA: SARS-CoV-2, NAA: NOT DETECTED

## 2020-09-13 IMAGING — CR DG SHOULDER 2+V*L*
3 series · 3 of 3 positions shown · non-contrast
Comparison: None.

CLINICAL DATA: Fall while playing basketball with shoulder pain,
initial encounter

EXAM:
LEFT SHOULDER - 2+ VIEW

[w shoulder grashey left]
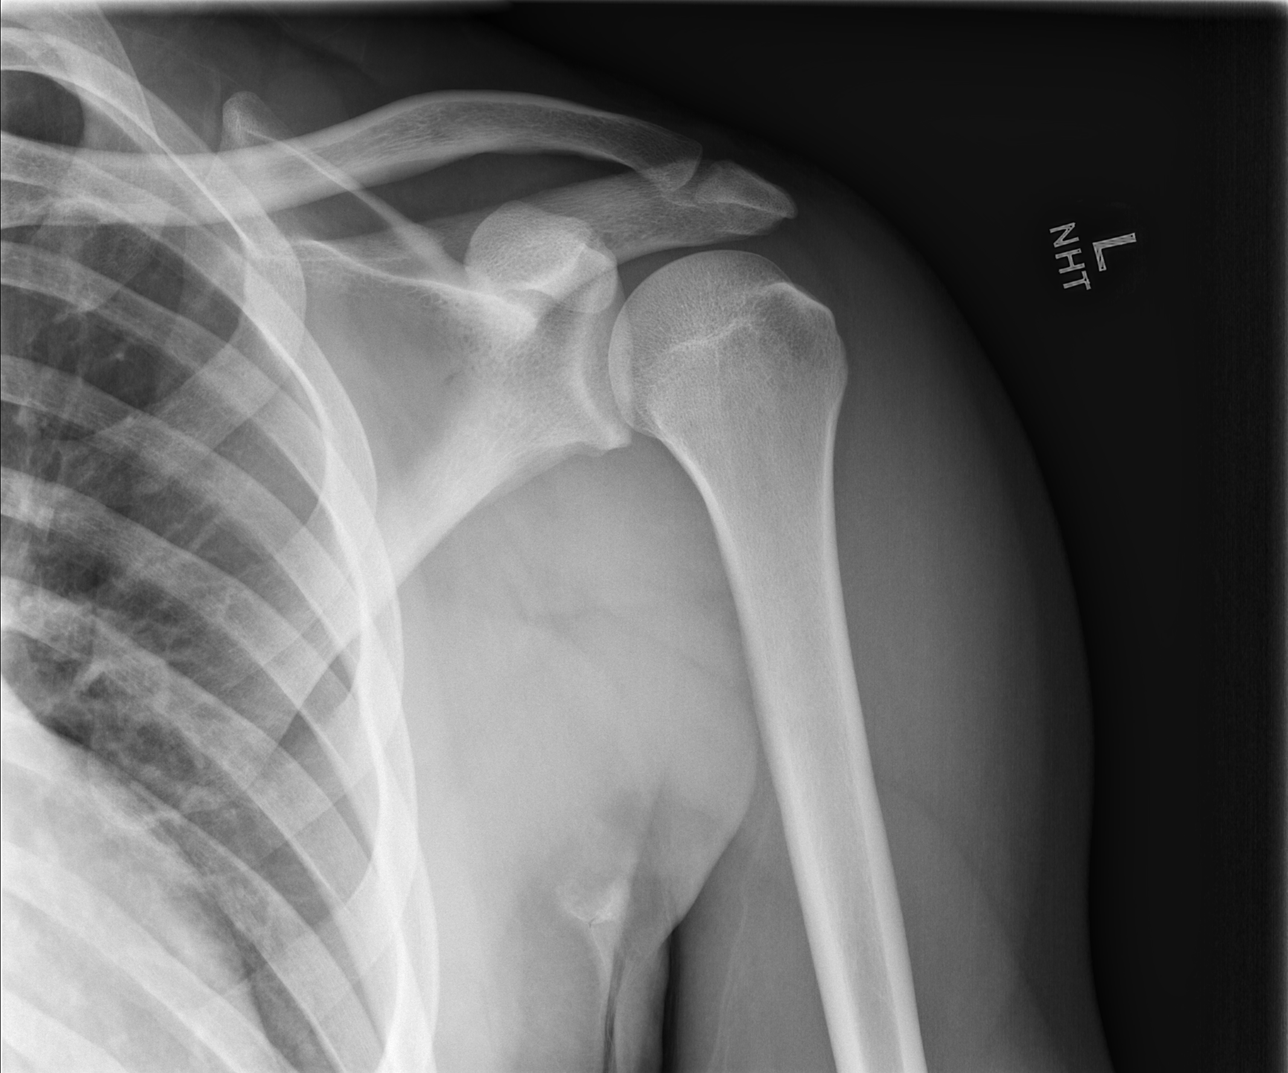

[w shoulder y view left]
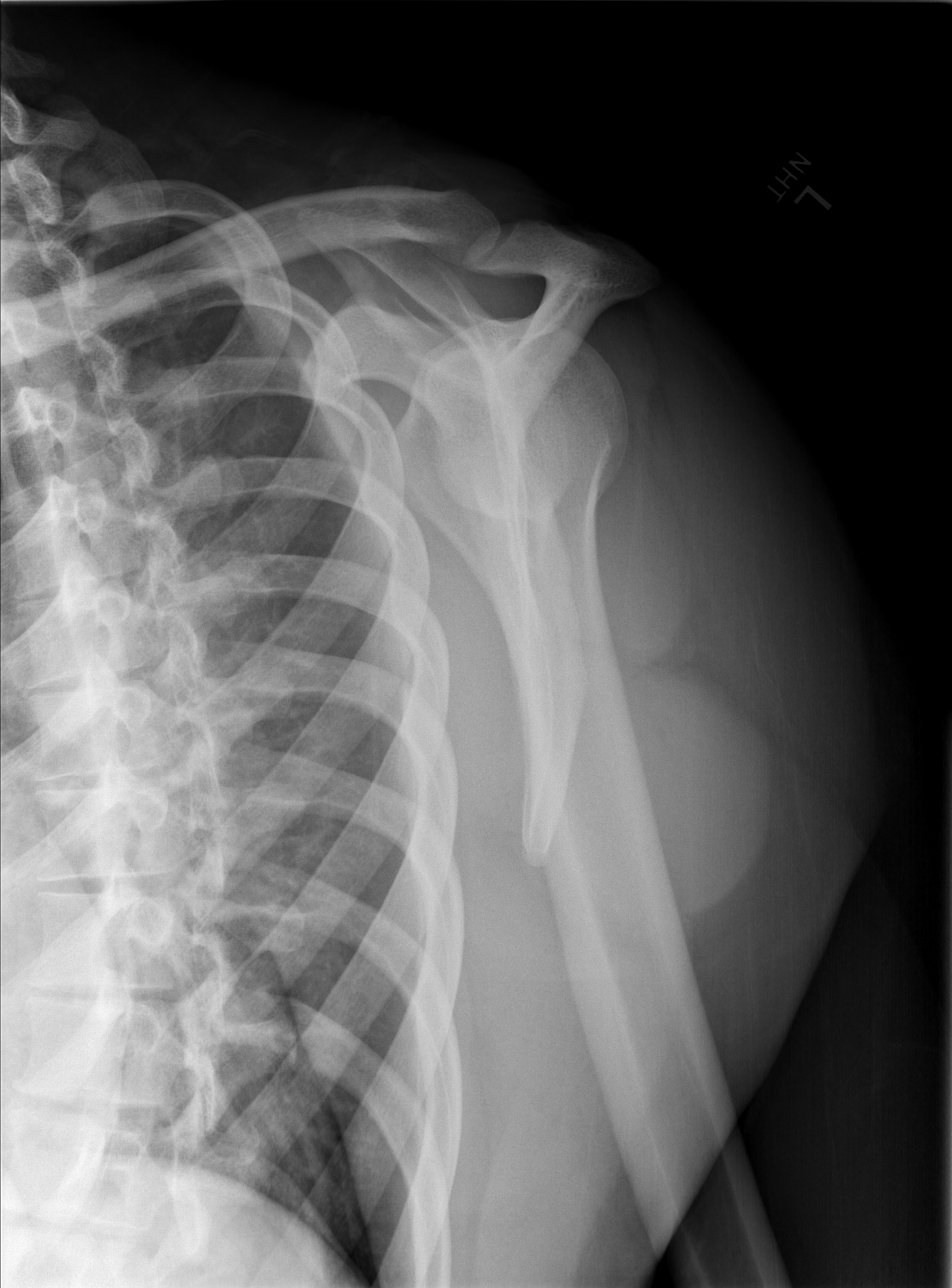

[x shoulder axillary left *]
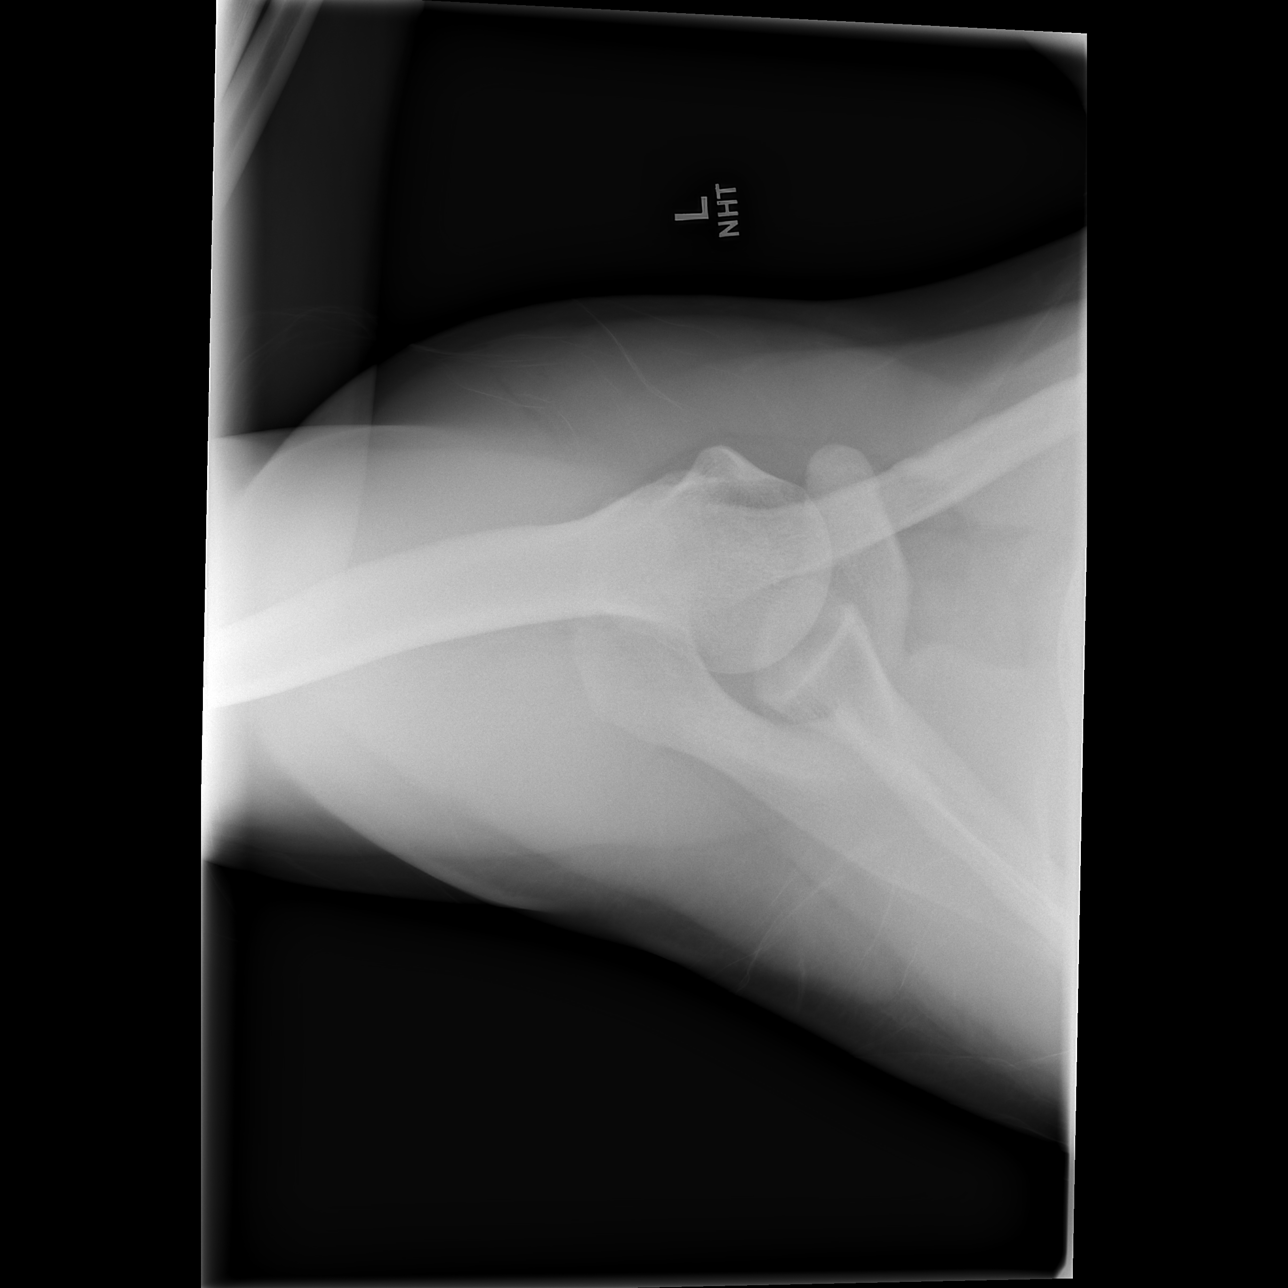

[3 of 3 positions shown; findings below may reference images not displayed]

FINDINGS: There is no evidence of fracture or dislocation. There is no
evidence of arthropathy or other focal bone abnormality. Soft
tissues are unremarkable.
IMPRESSION: No acute abnormality noted.

## 2020-10-12 ENCOUNTER — Emergency Department (HOSPITAL_BASED_OUTPATIENT_CLINIC_OR_DEPARTMENT_OTHER)
Admission: EM | Admit: 2020-10-12 | Discharge: 2020-10-12 | Disposition: A | Discharge status: Home / Self Care | Payer: Self-pay | Attending: Emergency Medicine | Admitting: Emergency Medicine

## 2020-10-12 ENCOUNTER — Other Ambulatory Visit: Payer: Self-pay

## 2020-10-12 ENCOUNTER — Emergency Department (HOSPITAL_BASED_OUTPATIENT_CLINIC_OR_DEPARTMENT_OTHER): Payer: Self-pay

## 2020-10-12 ENCOUNTER — Encounter (HOSPITAL_BASED_OUTPATIENT_CLINIC_OR_DEPARTMENT_OTHER): Payer: Self-pay | Admitting: Emergency Medicine

## 2020-10-12 DIAGNOSIS — Y92410 Unspecified street and highway as the place of occurrence of the external cause: Secondary | ICD-10-CM | POA: Insufficient documentation

## 2020-10-12 DIAGNOSIS — M25512 Pain in left shoulder: Secondary | ICD-10-CM | POA: Insufficient documentation

## 2020-10-12 DIAGNOSIS — R52 Pain, unspecified: Secondary | ICD-10-CM

## 2020-10-12 DIAGNOSIS — Z5321 Procedure and treatment not carried out due to patient leaving prior to being seen by health care provider: Secondary | ICD-10-CM | POA: Insufficient documentation
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
## 2020-10-12 NOTE — ED Notes (Signed)
Called patient's name in waiting room -- no answer at this time
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
